# Patient Record
Sex: Male | Born: 2000 | Race: Black or African American | Hispanic: No | Marital: Single
Health system: Southern US, Community
[De-identification: ages and names within clinical notes are randomized; demographics above are authoritative.]

## PROBLEM LIST (undated history)

## (undated) DIAGNOSIS — J353 Hypertrophy of tonsils with hypertrophy of adenoids: Secondary | ICD-10-CM

## (undated) DIAGNOSIS — G43909 Migraine, unspecified, not intractable, without status migrainosus: Secondary | ICD-10-CM

---
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
## 2021-01-12 ENCOUNTER — Ambulatory Visit: Payer: Medicaid Other | Admitting: Pediatrics

## 2021-01-14 ENCOUNTER — Encounter (HOSPITAL_COMMUNITY): Payer: Self-pay | Admitting: *Deleted

## 2021-01-14 ENCOUNTER — Emergency Department (HOSPITAL_COMMUNITY)
Admission: EM | Admit: 2021-01-14 | Discharge: 2021-01-14 | Disposition: A | Discharge status: Home / Self Care | Payer: Medicaid Other | Attending: Emergency Medicine | Admitting: Emergency Medicine

## 2021-01-14 DIAGNOSIS — J069 Acute upper respiratory infection, unspecified: Secondary | ICD-10-CM | POA: Insufficient documentation

## 2021-01-14 DIAGNOSIS — Z20822 Contact with and (suspected) exposure to covid-19: Secondary | ICD-10-CM | POA: Insufficient documentation

## 2021-01-14 DIAGNOSIS — J1089 Influenza due to other identified influenza virus with other manifestations: Secondary | ICD-10-CM | POA: Insufficient documentation

## 2021-01-14 DIAGNOSIS — R059 Cough, unspecified: Secondary | ICD-10-CM | POA: Diagnosis present

## 2021-01-14 HISTORY — DX: Migraine, unspecified, not intractable, without status migrainosus: G43.909

## 2021-01-14 LAB — RESP PANEL BY RT-PCR (FLU A&B, COVID) ARPGX2
Influenza A by PCR: NEGATIVE
Influenza B by PCR: NEGATIVE
SARS Coronavirus 2 by RT PCR: NEGATIVE

## 2021-01-14 LAB — GROUP A STREP BY PCR: Group A Strep by PCR: NOT DETECTED

## 2021-01-14 MED ORDER — AZITHROMYCIN 250 MG PO TABS: 250.0000 mg | ORAL_TABLET | Freq: Every day | ORAL | 0 refills | Status: AC

## 2021-01-14 MED ORDER — CLARITIN-D 12 HOUR 5-120 MG PO TB12: 1.0000 | ORAL_TABLET | Freq: Every morning | ORAL | 0 refills | Status: AC

## 2021-01-14 NOTE — ED Triage Notes (Signed)
Flu symptoms for 3 days

## 2021-01-14 NOTE — ED Notes (Signed)
Pt ambulated to restroom without difficulties.  

## 2021-01-14 NOTE — ED Provider Notes (Signed)
[facility] EMERGENCY DEPARTMENT Provider Note   CSN: [id number] Arrival date & time: 01/14/21  1149     History Chief Complaint  Patient presents with   flu symptoms    Joshua Sparks is a 20 y.o. male with no significant past medical history presenting with a 2 day history of uri type symptoms which includes nasal congestion with thick green rhinorrhea, sore throat and nonproductive cough.  Symptoms do not include shortness of breath, chest pain,  Nausea, vomiting or diarrhea, dizziness, headache, neck pain or stiffness, visual changes.  He has woke with bilateral eyes watery, itchy and semi crusted shut. Several episodes of sneezing.  He reports waking unable to breath through his nose and dry, burning throat which improves after drinking tea.  The patient has taken an otc nighttime cough/congestion medicine which does help him sleep.     The history is provided by the patient.      Past Medical History:  Diagnosis Date   Migraines     There are no problems to display for this patient.   The histories are not reviewed yet. Please review them in the "History" navigator section and refresh this SmartLink.     No family history on file.     Home Medications Prior to Admission medications   Medication Sig Start Date End Date Taking? Authorizing Provider  azithromycin (ZITHROMAX) 250 MG tablet Take 1 tablet (250 mg total) by mouth daily. Take first 2 tablets together, then 1 every day until finished. 01/14/21  Yes Jovan Colligan, Raynelle Fanning, PA-C  loratadine-pseudoephedrine (CLARITIN-D 12 HOUR) 5-120 MG tablet Take 1 tablet by mouth in the morning. 01/14/21  Yes IdolRaynelle Fanning, PA-C    Allergies    Patient has no known allergies.  Review of Systems   Review of Systems  Constitutional:  Negative for chills and fever.  HENT:  Positive for congestion, rhinorrhea, sneezing and sore throat. Negative for ear pain, hearing loss, sinus pressure, trouble swallowing and voice change.    Eyes:  Positive for discharge and itching.  Respiratory:  Positive for cough. Negative for shortness of breath, wheezing and stridor.   Cardiovascular:  Negative for chest pain.  Gastrointestinal:  Negative for abdominal pain.  Genitourinary: Negative.   Musculoskeletal: Negative.  Negative for neck pain.  Skin:  Negative for rash.   Physical Exam Updated Vital Signs BP 130/86   Pulse 90   Temp 98.5 F (36.9 C) (Oral)   Resp 18   Ht 6\' 3"  (1.905 m)   Wt (!) 143 kg   SpO2 97%   BMI 39.40 kg/m   Physical Exam Constitutional:      Appearance: He is well-developed.  HENT:     Head: Normocephalic and atraumatic.     Right Ear: Tympanic membrane and ear canal normal.     Left Ear: Tympanic membrane and ear canal normal.     Nose: Mucosal edema and rhinorrhea present.     Mouth/Throat:     Mouth: Mucous membranes are moist.     Pharynx: Oropharynx is clear. Uvula midline. No oropharyngeal exudate or posterior oropharyngeal erythema.     Tonsils: No tonsillar abscesses.  Eyes:     Conjunctiva/sclera: Conjunctivae normal.  Cardiovascular:     Rate and Rhythm: Normal rate.     Heart sounds: Normal heart sounds.  Pulmonary:     Effort: Pulmonary effort is normal. No respiratory distress.     Breath sounds: No wheezing or rales.  Abdominal:  Palpations: Abdomen is soft.     Tenderness: There is no abdominal tenderness.  Musculoskeletal:        General: Normal range of motion.  Skin:    General: Skin is warm and dry.     Findings: No rash.  Neurological:     Mental Status: He is alert and oriented to person, place, and time.    ED Results / Procedures / Treatments   Labs (all labs ordered are listed, but only abnormal results are displayed) Labs Reviewed  GROUP A STREP BY PCR  RESP PANEL BY RT-PCR (FLU A&B, COVID) [id number]    EKG None  Radiology No results found.  Procedures Procedures   Medications Ordered in ED Medications - No data to display  ED  Course  I have reviewed the triage vital signs and the nursing notes.  Pertinent labs & imaging results that were available during my care of the patient were reviewed by me and considered in my medical decision making (see chart for details).    MDM Rules/Calculators/A&P                           Patient with viral URI type symptoms, possibly also an allergy component.  He was started on loratidine with pseudoephedrine for daytime use.  Labs were reviewed and discussed with patient, he is negative for strep and flu/COVID.  We discussed other home treatments for symptom relief.  There is no acute indication for antibiotics at this time, although with the purulent sounding nasal discharge he may be developing an early sinusitis.  He was given a prescription for Zithromax but advised to only get this medication filled if he is not improving by this time on Saturday or he develops fevers, continuing colorful nasal discharge and/or sinus pain. Final Clinical Impression(s) / ED Diagnoses Final diagnoses:  Viral URI    Rx / DC Orders ED Discharge Orders          Ordered    loratadine-pseudoephedrine (CLARITIN-D 12 HOUR) 5-120 MG tablet  Every morning        01/14/21 1521    azithromycin (ZITHROMAX) 250 MG tablet  Daily        01/14/21 1521             Burgess Amor, PA-C 01/14/21 1532    Terrilee Files, MD 01/14/21 2118

## 2021-01-14 NOTE — Discharge Instructions (Signed)
Your symptoms suggest a viral upper respiratory infection which should run its course with your current treatment including your nighttime medication you are currently using.  You may take the medication prescribed for daytime use which is similar but will not make you drowsy.  As discussed, there is no indication for needing an antibiotic at this time, however if your symptoms are not improving over the next 3 days or you develop fevers and nasal or sinus pain as discussed in addition to continued green or bloody nasal drainage this would be an indication to start the antibiotic prescribed.  Get rechecked for any new or worsening symptoms.  Your COVID, flu test and strep tests are negative today.

## 2021-01-15 ENCOUNTER — Encounter (HOSPITAL_COMMUNITY): Payer: Self-pay | Admitting: Emergency Medicine

## 2021-01-15 ENCOUNTER — Telehealth: Payer: Self-pay

## 2021-01-15 NOTE — Telephone Encounter (Signed)
Transition Care Management Follow-up Telephone Call Date of discharge and from where: 01/14/2021-[facility]  How have you been since you were released from the hospital? Patient stated he is doing fine and will pick up medications today.  Any questions or concerns? No  Items Reviewed: Did the pt receive and understand the discharge instructions provided? Yes  Medications obtained and verified? Yes  Other? No  Any new allergies since your discharge? No  Dietary orders reviewed? No Do you have support at home? Yes   Home Care and Equipment/Supplies: Were home health services ordered? not applicable If so, what is the name of the agency? N/A  Has the agency set up a time to come to the patient's home? not applicable Were any new equipment or medical supplies ordered?  No What is the name of the medical supply agency? N/A Were you able to get the supplies/equipment? not applicable Do you have any questions related to the use of the equipment or supplies? No  Functional Questionnaire: (I = Independent and D = Dependent) ADLs: I  Bathing/Dressing- I  Meal Prep- I  Eating- I  Maintaining continence- I  Transferring/Ambulation- I  Managing Meds- I  Follow up appointments reviewed:  PCP Hospital f/u appt confirmed? No   Specialist Hospital f/u appt confirmed? No   Are transportation arrangements needed? Yes  If their condition worsens, is the pt aware to call PCP or go to the Emergency Dept.? Yes Was the patient provided with contact information for the PCP's office or ED? Yes Was to pt encouraged to call back with questions or concerns? Yes

## 2021-05-06 ENCOUNTER — Emergency Department (HOSPITAL_COMMUNITY): Payer: Medicaid Other

## 2021-05-06 ENCOUNTER — Other Ambulatory Visit: Payer: Self-pay

## 2021-05-06 ENCOUNTER — Encounter (HOSPITAL_COMMUNITY): Payer: Self-pay

## 2021-05-06 ENCOUNTER — Emergency Department (HOSPITAL_COMMUNITY)
Admission: EM | Admit: 2021-05-06 | Discharge: 2021-05-06 | Disposition: A | Discharge status: Home / Self Care | Payer: Medicaid Other | Attending: Emergency Medicine | Admitting: Emergency Medicine

## 2021-05-06 DIAGNOSIS — R519 Headache, unspecified: Secondary | ICD-10-CM | POA: Insufficient documentation

## 2021-05-06 DIAGNOSIS — R55 Syncope and collapse: Secondary | ICD-10-CM | POA: Diagnosis not present

## 2021-05-06 LAB — URINALYSIS, ROUTINE W REFLEX MICROSCOPIC
Bilirubin Urine: NEGATIVE
Glucose, UA: NEGATIVE mg/dL
Hgb urine dipstick: NEGATIVE
Ketones, ur: NEGATIVE mg/dL
Leukocytes,Ua: NEGATIVE
Nitrite: NEGATIVE
Protein, ur: NEGATIVE mg/dL
Specific Gravity, Urine: 1.016 (ref 1.005–1.030)
pH: 5 (ref 5.0–8.0)

## 2021-05-06 LAB — BASIC METABOLIC PANEL
Anion gap: 5 (ref 5–15)
BUN: 10 mg/dL (ref 6–20)
CO2: 25 mmol/L (ref 22–32)
Calcium: 9.1 mg/dL (ref 8.9–10.3)
Chloride: 105 mmol/L (ref 98–111)
Creatinine, Ser: 0.94 mg/dL (ref 0.61–1.24)
GFR, Estimated: >60 mL/min (ref >60)
Glucose, Bld: 90 mg/dL (ref 70–99)
Potassium: 4.1 mmol/L (ref 3.5–5.1)
Sodium: 135 mmol/L (ref 135–145)

## 2021-05-06 LAB — CBC
HCT: 48 % (ref 39.0–52.0)
Hemoglobin: 16 g/dL (ref 13.0–17.0)
MCH: 27 pg (ref 26.0–34.0)
MCHC: 33.3 g/dL (ref 30.0–36.0)
MCV: 80.9 fL (ref 80.0–100.0)
Platelets: 355 10*3/uL (ref 150–400)
RBC: 5.93 MIL/uL — ABNORMAL HIGH (ref 4.22–5.81)
RDW: 13.7 % (ref 11.5–15.5)
WBC: 4 10*3/uL (ref 4.0–10.5)
nRBC: 0 % (ref 0.0–0.2)

## 2021-05-06 NOTE — ED Triage Notes (Addendum)
Reports hx of migraines and this one started Wednesday and has been taking motrin with no relief. Denies n/v ? ?Patient reports he had a syncopal episode around 115p and didn't wake up until 150p.  I asked did he take a nap and said no he just fell over.   Reports headache is not worse as bad today as it was Wednesday.  ?

## 2021-05-06 NOTE — ED Provider Notes (Signed)
[facility] EMERGENCY DEPARTMENT Provider Note   CSN: [id number] Arrival date & time: 05/06/21  1624     History  Chief Complaint  Patient presents with   Migraine    Joshua Sparks is a 21 y.o. male.   Patient with medical history including migraines presents with complaints of headache for last couple of days, states the headache moves around he will feel in the middle of his head and back of his head, states the headache was initially intermittent but now has become more consistent, he has no associated change in vision paresthesias or weakness in the upper or lower extremities, denies any nausea vomiting lightheaded dizziness no neck tenderness no associated fevers or chills denies any recent head trauma, not on anticoag's patient states that he has had headaches like this in the past feels like his typical headache.  He also notes that he had a syncopal episode today states that after he put his siblings down for a nap he was standing and then collapsed.  He states that he woke up on the floor about 20 minutes later, no one witnessed this, he states that he did not become incontinent did not bite his tongue states that he felt fine after the incident, prior to passing out he did not have any chest pain shortness of breath becoming diaphoretic he states that he thinks he might of just was too hot and fainted.  He has no other complaints at this time.  Home Medications Prior to Admission medications   Medication Sig Start Date End Date Taking? Authorizing Provider  azithromycin (ZITHROMAX) 250 MG tablet Take 1 tablet (250 mg total) by mouth daily. Take first 2 tablets together, then 1 every day until finished. 01/14/21   Burgess Amor, PA-C  famotidine (PEPCID) 20 MG tablet Take 1 tablet (20 mg total) by mouth daily. 04/09/18   Loren Racer, MD  ibuprofen (ADVIL) 600 MG tablet Take 1 tablet (600 mg total) by mouth 3 (three) times daily. 09/22/18   Ivery Quale, PA-C   loratadine-pseudoephedrine (CLARITIN-D 12 HOUR) 5-120 MG tablet Take 1 tablet by mouth in the morning. 01/14/21   Burgess Amor, PA-C  methocarbamol (ROBAXIN) 500 MG tablet Take 1 tablet (500 mg total) by mouth 2 (two) times daily. 09/22/18   Ivery Quale, PA-C  SUMAtriptan (IMITREX) 50 MG tablet Take 1 tablet by mouth with 400 mg of ibuprofen may repeat in 2 hours if headache persists or recurs. 05/26/17   Burgess Amor, PA-C      Allergies    Patient has no known allergies.    Review of Systems   Review of Systems  Physical Exam Updated Vital Signs BP 94/78 (BP Location: Right Arm)    Pulse 94    Temp 98.2 F (36.8 C) (Oral)    Resp 17    Ht 6\' 3"  (1.905 m)    Wt (!) 142.9 kg    SpO2 97%    BMI 39.37 kg/m  Physical Exam Vitals and nursing note reviewed.  Constitutional:      General: He is not in acute distress.    Appearance: He is not ill-appearing.  HENT:     Head: Normocephalic and atraumatic.     Nose: No congestion.  Eyes:     Extraocular Movements: Extraocular movements intact.     Conjunctiva/sclera: Conjunctivae normal.     Pupils: Pupils are equal, round, and reactive to light.  Cardiovascular:     Rate and Rhythm: Normal rate and regular  rhythm.     Pulses: Normal pulses.     Heart sounds: No murmur heard.   No friction rub. No gallop.  Pulmonary:     Effort: No respiratory distress.     Breath sounds: No wheezing, rhonchi or rales.  Musculoskeletal:     Comments: Patient is moving all 4 extremities, neurovascularly intact upper lower extremities.  Spine is nontender to palpation.  Skin:    General: Skin is warm and dry.  Neurological:     Mental Status: He is alert.     GCS: GCS eye subscore is 4. GCS verbal subscore is 5. GCS motor subscore is 6.     Cranial Nerves: No cranial nerve deficit.     Sensory: Sensation is intact.     Motor: No weakness.     Coordination: Finger-Nose-Finger Test normal.     Gait: Gait is intact.     Comments: Cranial nerves II  through XII grossly intact no difficulty word finding following two-step commands no real weakness present gait fully intact.  Psychiatric:        Mood and Affect: Mood normal.    ED Results / Procedures / Treatments   Labs (all labs ordered are listed, but only abnormal results are displayed) Labs Reviewed  CBC - Abnormal; Notable for the following components:      Result Value   RBC 5.93 (*)    All other components within normal limits  BASIC METABOLIC PANEL  URINALYSIS, ROUTINE W REFLEX MICROSCOPIC    EKG EKG Interpretation  Date/Time:  Thursday May 06 2021 16:45:17 EST Ventricular Rate:  101 PR Interval:  142 QRS Duration: 82 QT Interval:  326 QTC Calculation: 422 R Axis:   78 Text Interpretation: Sinus tachycardia Abnormal QRS-T angle, consider primary T wave abnormality Abnormal ECG When compared with ECG of 09-Apr-2018 20:18, PREVIOUS ECG IS PRESENT Confirmed by Bethann Berkshire [phone]) on 05/06/2021 8:59:52 PM  Radiology CT Head Wo Contrast  Result Date: 05/06/2021 CLINICAL DATA:  Syncope/presyncope, cerebrovascular cause suspected. Headache. EXAM: CT HEAD WITHOUT CONTRAST TECHNIQUE: Contiguous axial images were obtained from the base of the skull through the vertex without intravenous contrast. RADIATION DOSE REDUCTION: This exam was performed according to the departmental dose-optimization program which includes automated exposure control, adjustment of the mA and/or kV according to patient size and/or use of iterative reconstruction technique. COMPARISON:  None. FINDINGS: Brain: No acute intracranial abnormality. Specifically, no hemorrhage, hydrocephalus, mass lesion, acute infarction, or significant intracranial injury. Vascular: No hyperdense vessel or unexpected calcification. Skull: No acute calvarial abnormality. Sinuses/Orbits: No acute findings Other: None IMPRESSION: Normal study. Electronically Signed   By: Charlett Nose M.D.   On: 05/06/2021 17:07     Procedures Procedures    Medications Ordered in ED Medications - No data to display  ED Course/ Medical Decision Making/ A&P                           Medical Decision Making Amount and/or Complexity of Data Reviewed Labs: ordered. Radiology: ordered.   This patient presents to the ED for concern of headache and syncope, this involves an extensive number of treatment options, and is a complaint that carries with it a high risk of complications and morbidity.  The differential diagnosis includes CVA, intracranial head bleed, CVA, metabolic abnormality    Additional history obtained:  Additional history obtained from N/A    Co morbidities that complicate the patient evaluation  N/A  Social Determinants of Health:  N/A    Lab Tests:  I Ordered, and personally interpreted labs.  The pertinent results include: CBC unremarkable BMP unremarkable UA unremarkable   Imaging Studies ordered:  I ordered imaging studies including CT head I independently visualized and interpreted imaging which showed negative acute finding I agree with the radiologist interpretation   Cardiac Monitoring:  The patient was maintained on a cardiac monitor.  I personally viewed and interpreted the cardiac monitored which showed an underlying rhythm of: EKG sinus without signs of ischemia   Medicines ordered and prescription drug management:  I ordered medication including N/A I have reviewed the patients home medicines and have made adjustments as needed    Rule out low suspicion for CVA or intracranial head bleed as patient denies change in vision, paresthesias or weakness to upper lower extremities, no neuro deficits noted on exam, CT head did not reveal any acute findings.  Low suspicion for ACS or arrhythmias as patient denies chest pain, shortness of breath, no hypoperfusion or fluid overload on exam, EKG sinus without signs of ischemia.  Low suspicion for seizure as presentation  is atypical of etiology has no history of this, no postictal state, no urinary incontinency no tongue biting.  Low suspicion for systemic infection as patient is nontoxic-appearing, vital signs reassuring, no obvious source infection noted on exam.       Dispostion and problem list  After consideration of the diagnostic results and the patients response to treatment, I feel that the patent would benefit from discharge.  Headache since resolved-likely tension headache, will recommend over-the-counter pain medications, follow-up with neurology for further evaluation. Syncope-unclear etiology possibly vasovagal follow-up with PCP for further evaluation.            Final Clinical Impression(s) / ED Diagnoses Final diagnoses:  Bad headache  Syncope, unspecified syncope type    Rx / DC Orders ED Discharge Orders     None         Barnie Del 05/06/21 2101    Bethann Berkshire, MD 05/08/21 [phone]

## 2021-05-06 NOTE — Discharge Instructions (Signed)
Headache-please remember to stay hydrated, get plenty of sleep, decrease  stress as this will help to mitigate future migraines.  Also recommend ibuprofen and Tylenol for future headaches.  Please follow-up with neurology for further evaluation. ?Fainted-please follow-up with your PCP for further evaluation. ? ?Come back to the emergency department if you develop chest pain, shortness of breath, severe abdominal pain, uncontrolled nausea, vomiting, diarrhea. ? ?

## 2023-06-20 IMAGING — CT CT HEAD W/O CM
4 series · 16 of 47 positions shown, 18 images · non-contrast
Comparison: None.

CLINICAL DATA: Syncope/presyncope, cerebrovascular cause suspected.
Headache.



[Series 2: head w o · axial · 0.43mm/px · z∈[+45,+165]mm · 7 of 33 slices shown, 9 images]
[im 5/33  brain]
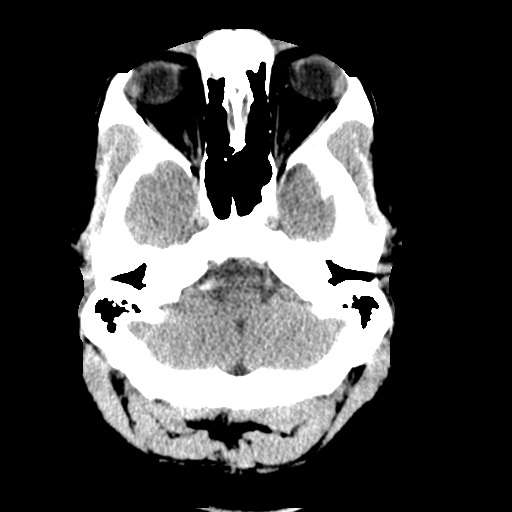
[im 5/33  bone]
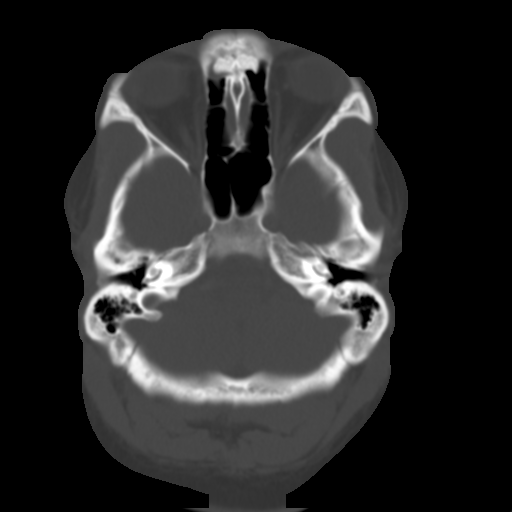
[im 9/33  brain]
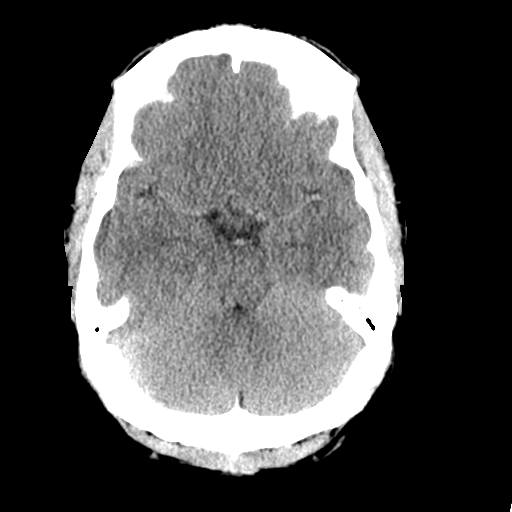
[im 13/33  brain]
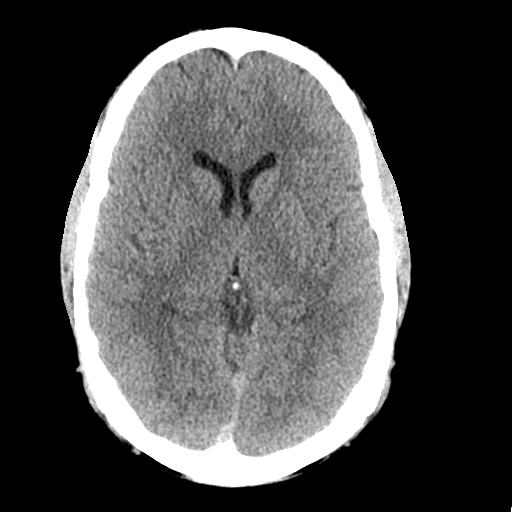
[im 17/33  brain]
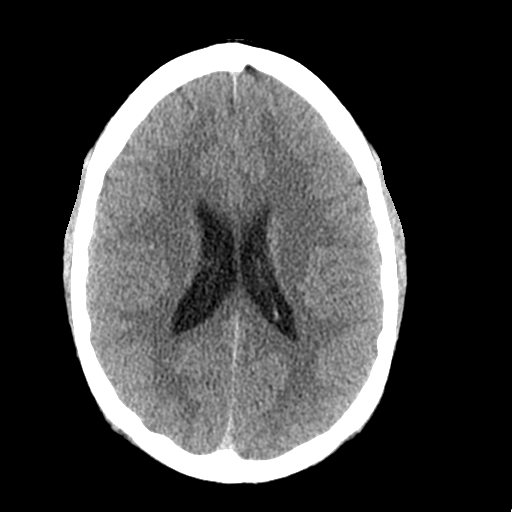
[im 21/33  brain]
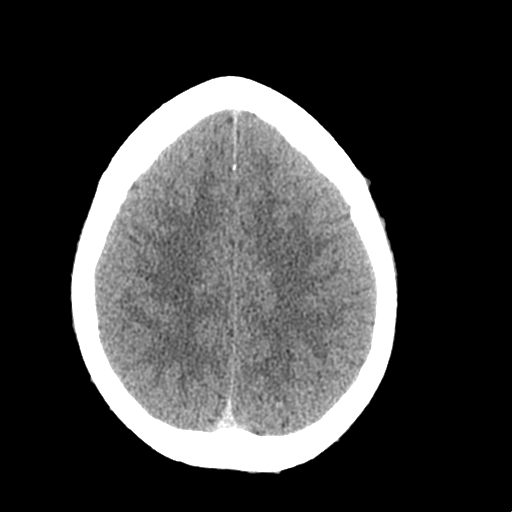
[im 21/33  bone]
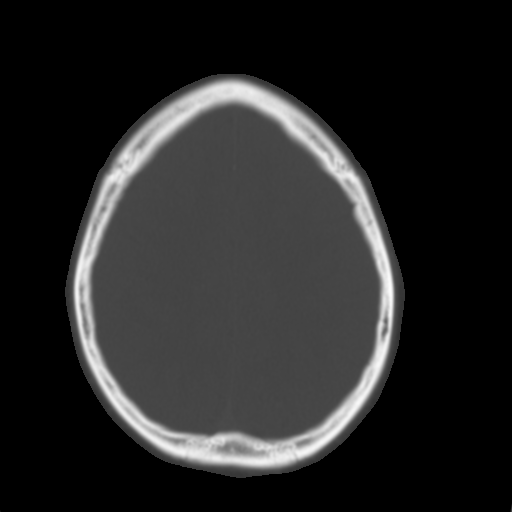
[im 25/33  brain]
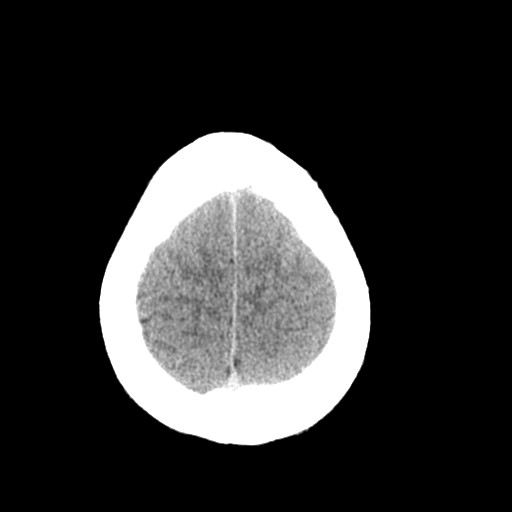
[im 29/33  brain]
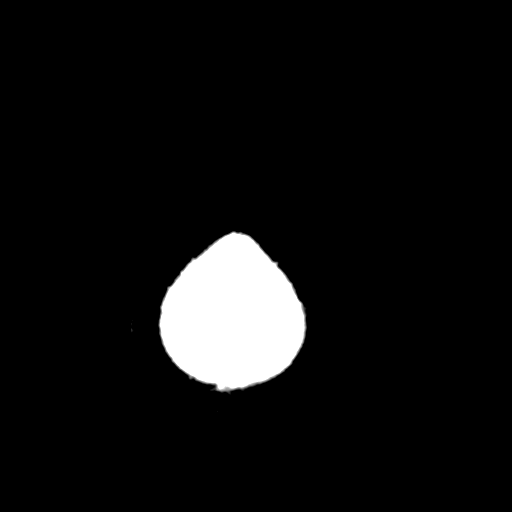

[Series 3: head bone · axial · 0.43mm/px · z∈[+41,+73]mm · 3 of 82 slices shown]
[im 9/82  bone]
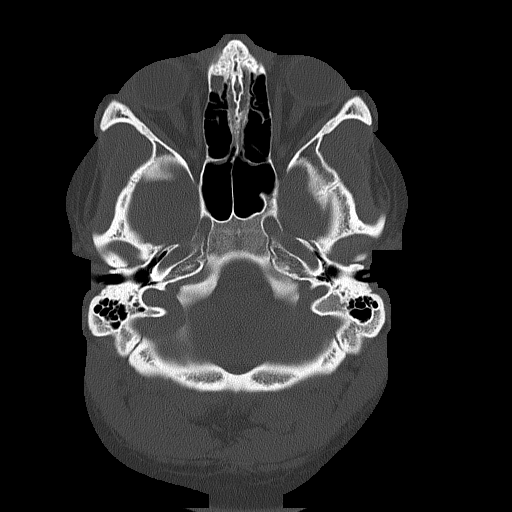
[im 17/82  bone]
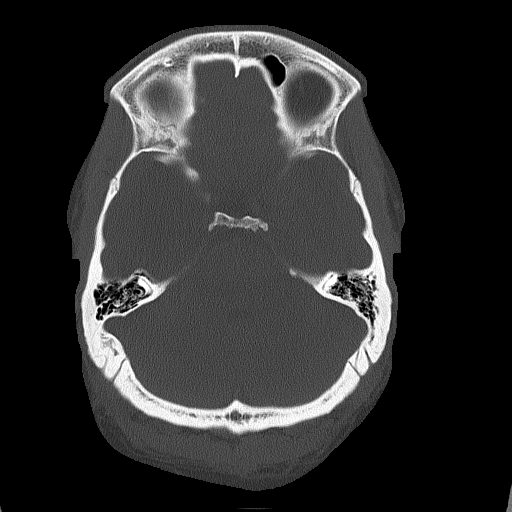
[im 25/82  bone]
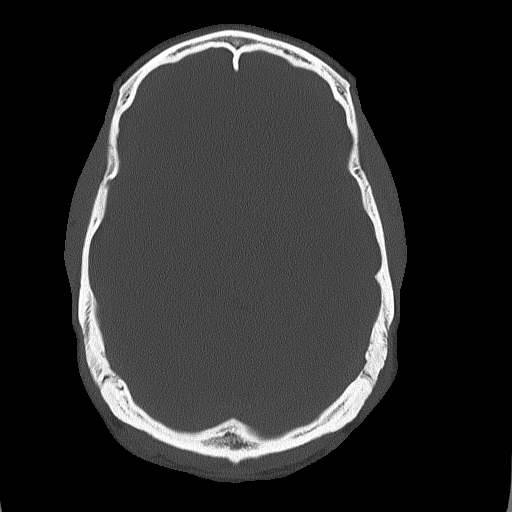

[Series 4: coronal soft · coronal · 0.33mm/px · 3 of 80 slices shown]
[im 27/80  brain]
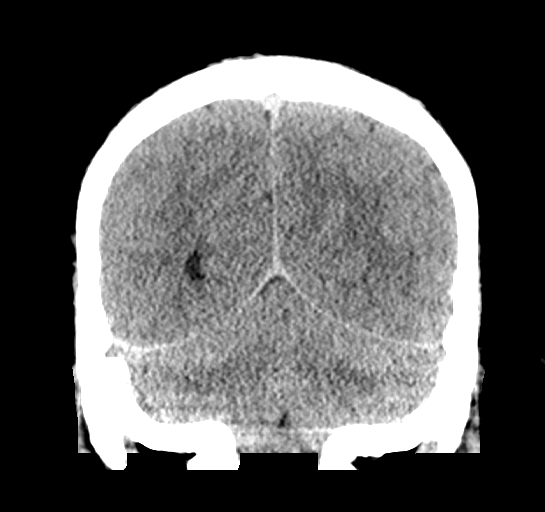
[im 36/80  brain]
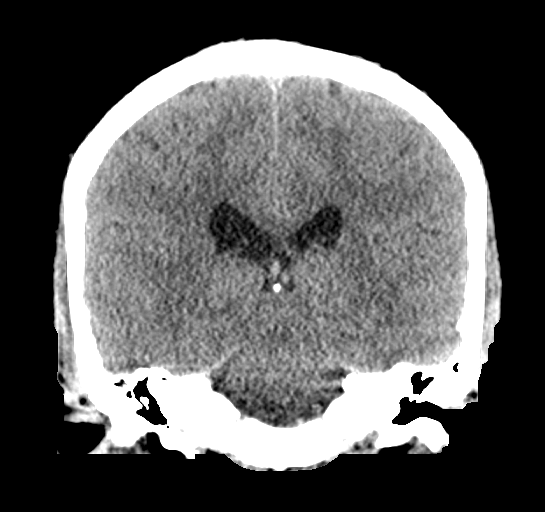
[im 44/80  brain]
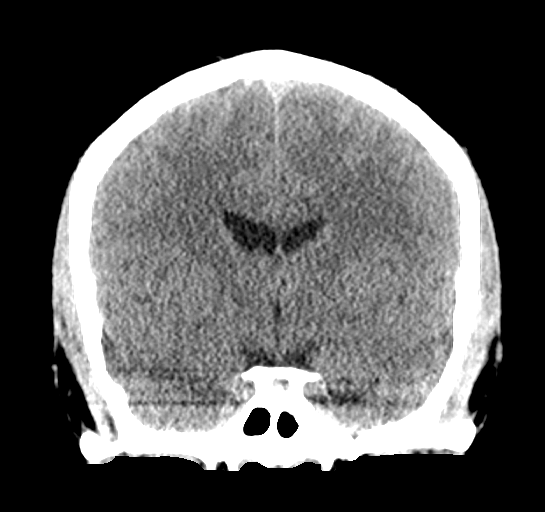

[Series 5: sagittal soft · sagittal · 0.33mm/px · 3 of 61 slices shown]
[im 21/61  brain]
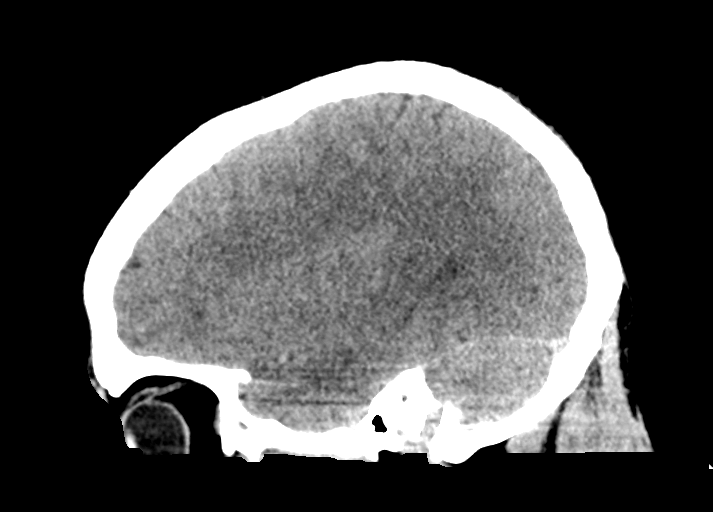
[im 31/61  brain]
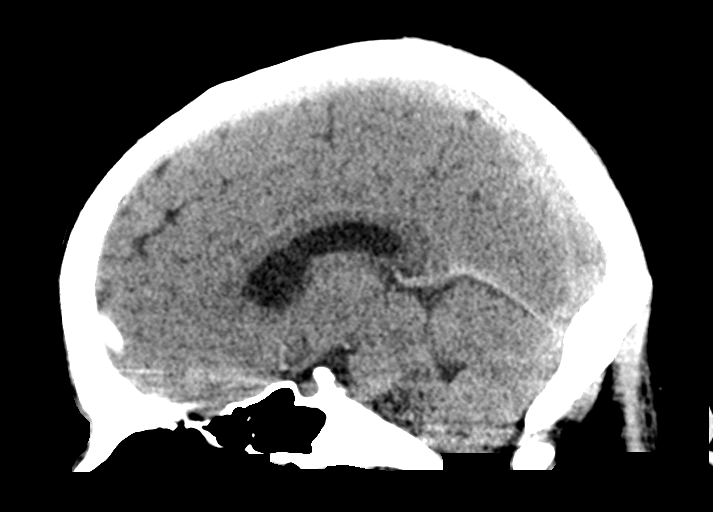
[im 41/61  brain]
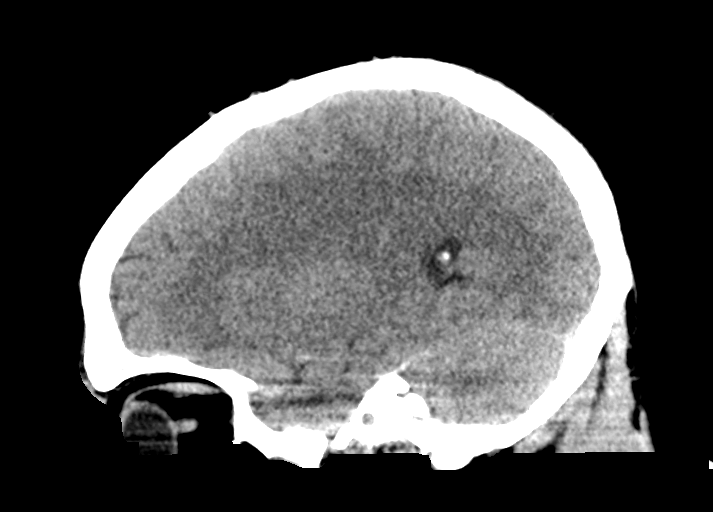

[16 of 47 positions shown; findings below may reference images not displayed]

FINDINGS: Brain: No acute intracranial abnormality. Specifically, no
hemorrhage, hydrocephalus, mass lesion, acute infarction, or
significant intracranial injury.

Vascular: No hyperdense vessel or unexpected calcification.

Skull: No acute calvarial abnormality.

Sinuses/Orbits: No acute findings

Other: None
IMPRESSION: Normal study.
